# Patient Record
Sex: Female | Born: 1995 | Hispanic: No | Marital: Single | Smoking: Never smoker
Health system: Southern US, Community
[De-identification: ages and names within clinical notes are randomized; demographics above are authoritative.]

## PROBLEM LIST (undated history)

## (undated) DIAGNOSIS — J45909 Unspecified asthma, uncomplicated: Secondary | ICD-10-CM

---
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
## 2014-08-24 ENCOUNTER — Other Ambulatory Visit: Payer: Self-pay | Admitting: Advanced Practice Midwife

## 2015-04-03 IMAGING — CT CT NECK W/ CM
4 of 5 series · 16 of 33 positions shown, 18 images · IV contrast (Iodine)
Comparison: Prior radiograph performed earlier on the same day.

CLINICAL DATA: Sore throat

EXAM:
CT NECK WITH CONTRAST
TECHNIQUE: Multidetector CT imaging of the neck was performed using the
standard protocol following the bolus administration of intravenous
contrast.
CONTRAST:  75 cc of Omni 300.

[Series 201: soft tissue, idose (2) · axial · 0.45mm/px · z∈[+461,+587]mm · 4 of 107 slices shown]
[im 22/107  soft-tissue]
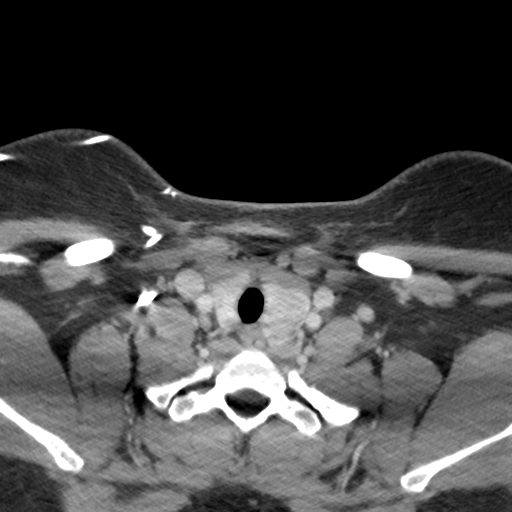
[im 43/107  soft-tissue]
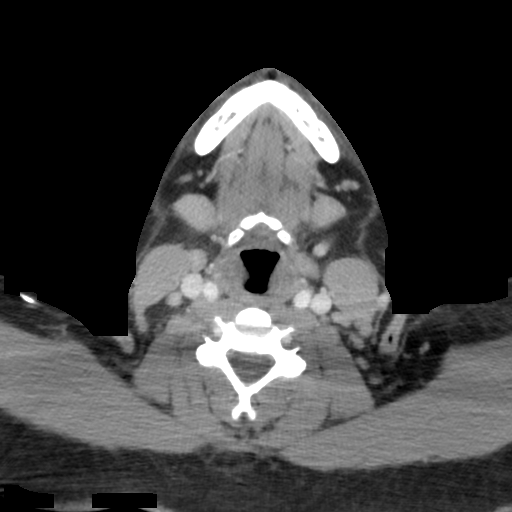
[im 64/107  soft-tissue]
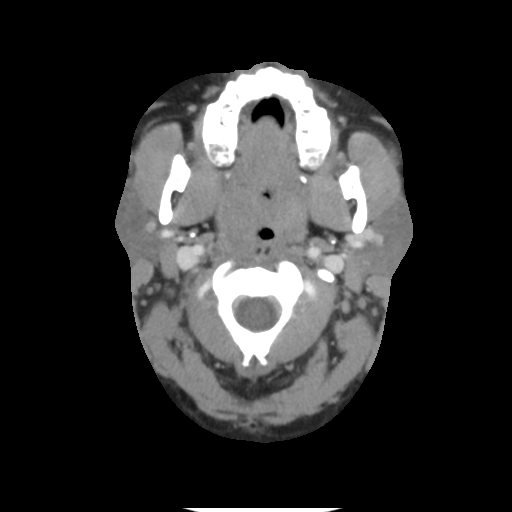
[im 85/107  soft-tissue]
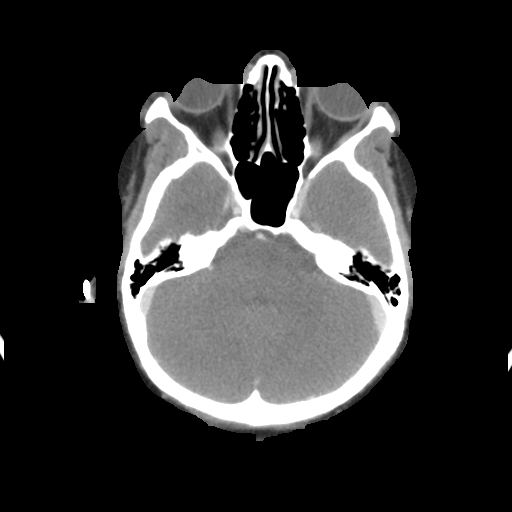

[Series 203: coronal, idose (2) · coronal · 0.45mm/px · 3 of 115 slices shown]
[im 23/115  bone]
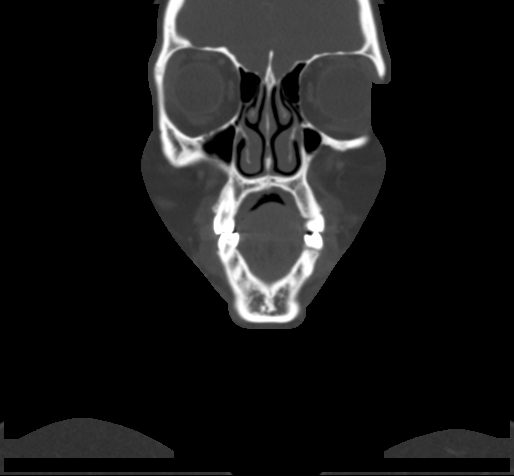
[im 46/115  bone]
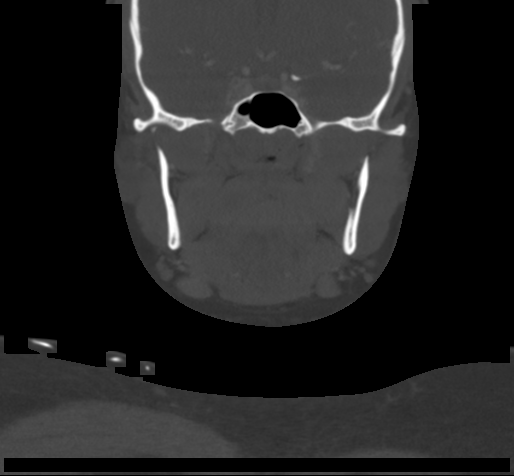
[im 69/115  bone]
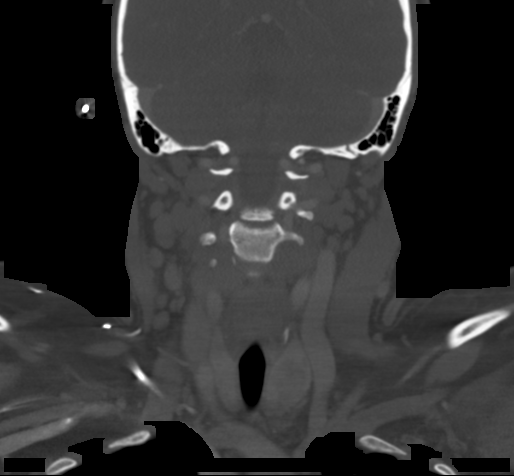

[Series 204: sagittal, idose (2) · sagittal · 0.45mm/px · 5 of 78 slices shown, 6 images]
[im 26/78  bone]
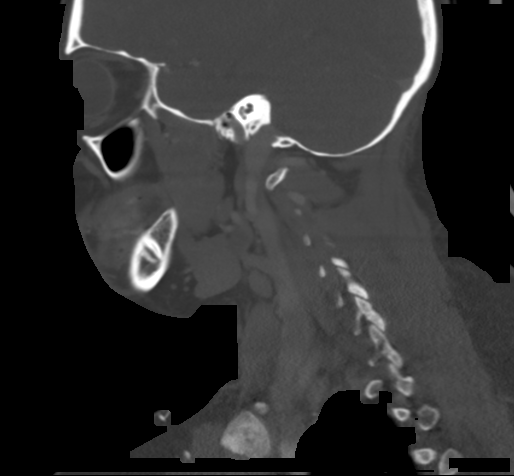
[im 33/78  bone]
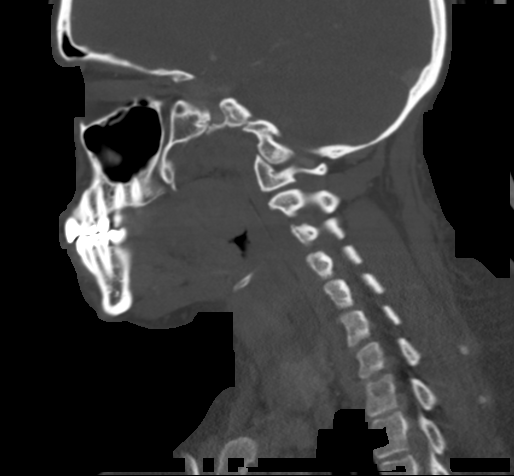
[im 39/78  soft-tissue]
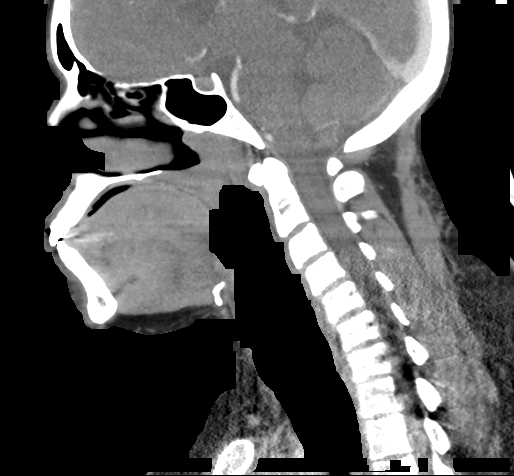
[im 39/78  bone]
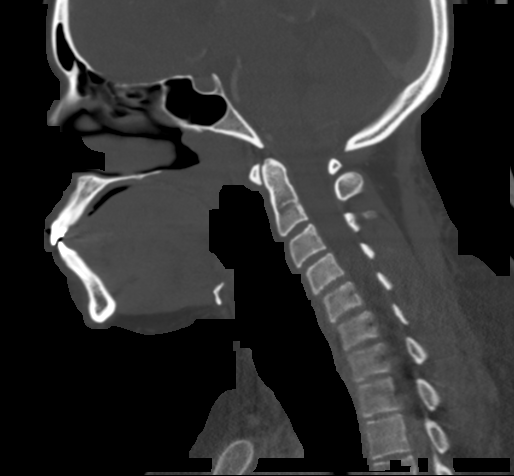
[im 45/78  bone]
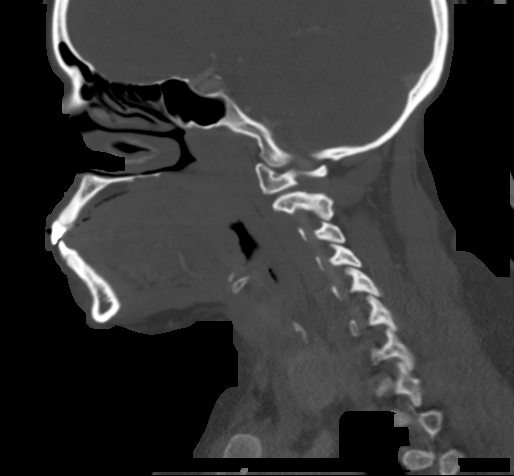
[im 52/78  bone]
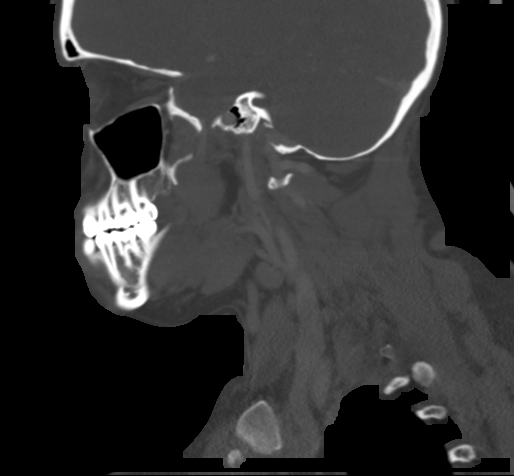

[Series 205: orthogonal, idose (2) · axial · 0.54mm/px · z∈[+441,+575]mm · 4 of 115 slices shown, 5 images]
[im 23/115  soft-tissue]
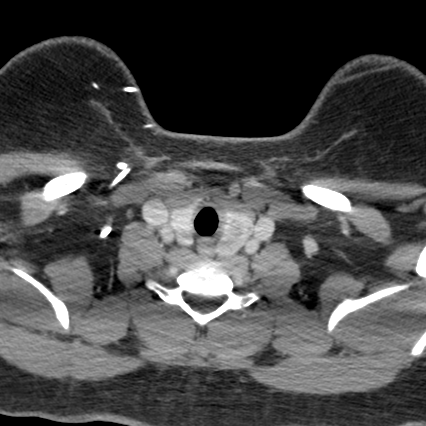
[im 23/115  bone]
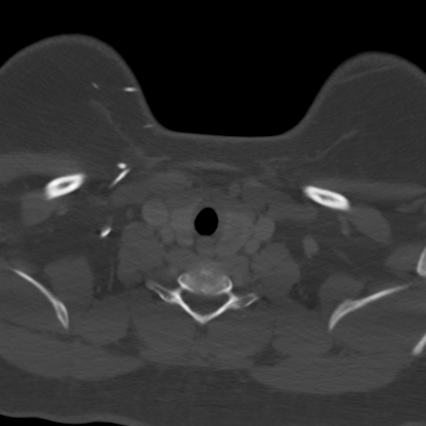
[im 46/115  bone]
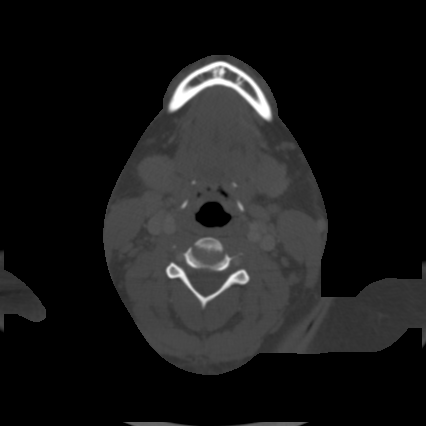
[im 69/115  bone]
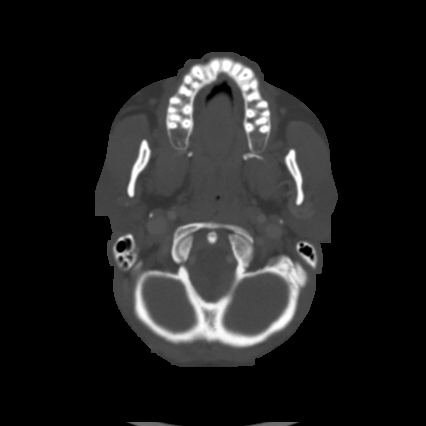
[im 92/115  bone]
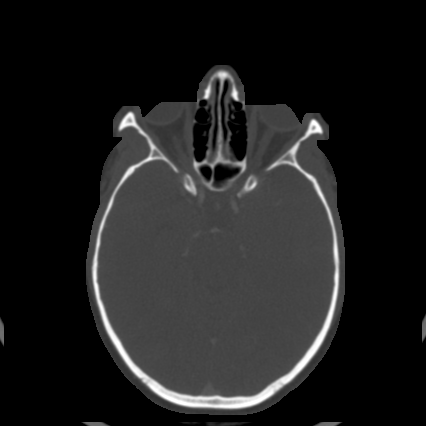

[16 of 33 positions shown; findings below may reference images not displayed]

FINDINGS: The visualized portions of the brain demonstrate a normal
appearance. Orbits are unremarkable.

Paranasal sinuses and mastoid air cells are clear.

The salivary glands including the parotid glands and submandibular
glands are normal. No significant submental soft tissue swelling.

Oral cavity within normal limits. Tonsils are symmetric and normal
bilaterally. Parapharyngeal fat is preserved. Nasopharynx within
normal limits. Mild swelling of the uvula noted.

No retropharyngeal fluid collection. Vallecula and lingual tonsils
are normal. No significant epiglottic swelling seen. Hypopharynx and
supraglottic larynx are normal. True vocal cords symmetric.
Subglottic airway is within normal limits.

Left lobe of thyroid is asymmetrically enlarged with a 1.4 x 1.9 x
1.7 cm heterogeneous hypodense nodule (series 204, image 45).
Subcentimeter 8 mm nodule noted within the posterior right thyroid
lobe.

Measure 1.1 cm in short axis. No other adenopathy within the neck.
No mass lesion or loculated fluid collection. Visualized superior
mediastinum within normal limits.

Visualized lungs are clear.

Normal intravascular enhancement seen within the neck. No acute
osseous abnormality. No worrisome lytic or blastic osseous lesions.
IMPRESSION: 1. Mild uvular swelling. No other significant inflammatory changes
identified within the neck. No evidence of epiglottitis.
2. Mildly prominent 1.1 cm bilateral level IIa nodes, nonspecific,
but may be reactive in nature. No other adenopathy.
3. 1.4 x 1.9 x 1.7 cm hypodense left thyroid nodule, indeterminate.
Further evaluation with dedicated thyroid ultrasound recommended.
This could be performed on a nonemergent basis.

## 2015-12-28 ENCOUNTER — Emergency Department (HOSPITAL_COMMUNITY)
Admission: EM | Admit: 2015-12-28 | Discharge: 2015-12-28 | Disposition: A | Discharge status: Home / Self Care | Payer: BLUE CROSS/BLUE SHIELD | Attending: Physician Assistant | Admitting: Physician Assistant

## 2015-12-28 ENCOUNTER — Encounter (HOSPITAL_COMMUNITY): Payer: Self-pay | Admitting: Emergency Medicine

## 2015-12-28 DIAGNOSIS — M940 Chondrocostal junction syndrome [Tietze]: Secondary | ICD-10-CM | POA: Insufficient documentation

## 2015-12-28 DIAGNOSIS — Z91010 Allergy to peanuts: Secondary | ICD-10-CM | POA: Insufficient documentation

## 2015-12-28 DIAGNOSIS — R0789 Other chest pain: Secondary | ICD-10-CM | POA: Diagnosis present

## 2015-12-28 DIAGNOSIS — J45909 Unspecified asthma, uncomplicated: Secondary | ICD-10-CM | POA: Diagnosis not present

## 2015-12-28 MED ORDER — IBUPROFEN 600 MG PO TABS: 600.0000 mg | ORAL_TABLET | Freq: Three times a day (TID) | ORAL | 0 refills | Status: AC | PRN

## 2015-12-28 MED ORDER — IBUPROFEN 800 MG PO TABS: 800.0000 mg | ORAL_TABLET | Freq: Three times a day (TID) | ORAL | 0 refills | Status: AC

## 2015-12-28 MED ORDER — CYCLOBENZAPRINE HCL 5 MG PO TABS: 10.0000 mg | ORAL_TABLET | Freq: Two times a day (BID) | ORAL | 0 refills | Status: AC | PRN

## 2015-12-28 MED ORDER — IBUPROFEN 800 MG PO TABS: 800.0000 mg | ORAL_TABLET | Freq: Four times a day (QID) | ORAL | 0 refills | Status: AC | PRN

## 2015-12-28 NOTE — ED Provider Notes (Signed)
[facility]-EMERGENCY DEPT Provider Note   CSN: [id number] Arrival date & time: 12/28/15  1239     History   Chief Complaint Chief Complaint  Patient presents with  . Shoulder Pain  . Other    rib pain    HPI Alexandria Bush is a 20 y.o. female.  Pt comes in with c/o left sided chest pain that radiates to the back. Denies sob, cough, fever, n/v. She states that she has been seen and was diagnosed with chest wall pain. She had an x-ray and it was negative. She states that the ibuprofen works when she takes it. She has not needed and albuterol. She denies any rash. She states that she is here because her mother told her she had to come.      Past Medical History:  Diagnosis Date  . Asthma     There are no active problems to display for this patient.   History reviewed. No pertinent surgical history.  OB History    No data available       Home Medications    Prior to Admission medications   Medication Sig Start Date End Date Taking? Authorizing Provider  albuterol (PROVENTIL HFA;VENTOLIN HFA) 108 (90 BASE) MCG/ACT inhaler Inhale 2 puffs into the lungs every 6 (six) hours as needed for wheezing or shortness of breath.    Historical Provider, MD  amoxicillin (AMOXIL) 500 MG capsule Take 1 capsule (500 mg total) by mouth 3 (three) times daily. 12/14/13   Azalia Bilis, MD  cyclobenzaprine (FLEXERIL) 5 MG tablet Take 2 tablets (10 mg total) by mouth 2 (two) times daily as needed for muscle spasms. 12/28/15   Teressa Lower, NP  etonogestrel-ethinyl estradiol (NUVARING) 0.12-0.015 MG/24HR vaginal ring Place 1 each vaginally every 28 (twenty-eight) days. Insert vaginally and leave in place for 3 consecutive weeks, then remove for 1 week.    Historical Provider, MD  fluticasone (FLONASE) 50 MCG/ACT nasal spray Place 2 sprays into both nostrils daily as needed for allergies or rhinitis.    Historical Provider, MD  HYDROcodone-acetaminophen (NORCO/VICODIN) 5-325 MG per tablet Take 1 tablet  by mouth every 4 (four) hours as needed for moderate pain. 12/14/13   Azalia Bilis, MD  ibuprofen (ADVIL,MOTRIN) 600 MG tablet Take 1 tablet (600 mg total) by mouth every 8 (eight) hours as needed. 12/28/15   Teressa Lower, NP  ibuprofen (ADVIL,MOTRIN) 800 MG tablet Take 1 tablet (800 mg total) by mouth 3 (three) times daily. 12/28/15   Teressa Lower, NP  ibuprofen (ADVIL,MOTRIN) 800 MG tablet Take 1 tablet (800 mg total) by mouth every 6 (six) hours as needed for fever or moderate pain. 12/28/15   Teressa Lower, NP  montelukast (SINGULAIR) 10 MG tablet Take 10 mg by mouth daily as needed (allergies).    Historical Provider, MD  ondansetron (ZOFRAN ODT) 8 MG disintegrating tablet Take 1 tablet (8 mg total) by mouth every 8 (eight) hours as needed for nausea or vomiting. 12/14/13   Azalia Bilis, MD    Family History History reviewed. No pertinent family history.  Social History Social History  Substance Use Topics  . Smoking status: Never Smoker  . Smokeless tobacco: Not on file  . Alcohol use No     Allergies   Peanut-containing drug products and Shellfish allergy   Review of Systems Review of Systems  All other systems reviewed and are negative.    Physical Exam Updated Vital Signs BP 110/65   Pulse 72   Temp 97.7 F (36.5  C) (Oral)   Resp 20   SpO2 100%   Physical Exam  Constitutional: She appears well-developed and well-nourished.  HENT:  Head: Normocephalic.  Neck: Normal range of motion. Neck supple.  Cardiovascular: Normal rate.   Pulmonary/Chest: Effort normal and breath sounds normal.    Abdominal: Soft. Bowel sounds are normal.  Musculoskeletal: Normal range of motion.  Neurological: She is alert.  Skin: Skin is warm.  Psychiatric: She has a normal mood and affect.  Nursing note and vitals reviewed.    ED Treatments / Results  Labs (all labs ordered are listed, but only abnormal results are displayed) Labs Reviewed - No data to display  EKG   EKG Interpretation None       Radiology No results found.  Procedures Procedures (including critical care time)  Medications Ordered in ED Medications - No data to display   Initial Impression / Assessment and Plan / ED Course  I have reviewed the triage vital signs and the nursing notes.  Pertinent labs & imaging results that were available during my care of the patient were reviewed by me and considered in my medical decision making (see chart for details).  Clinical Course    Discussed supportive treatment and follow up precautions.  Final Clinical Impressions(s) / ED Diagnoses   Final diagnoses:  Costochondritis    New Prescriptions New Prescriptions   CYCLOBENZAPRINE (FLEXERIL) 5 MG TABLET    Take 2 tablets (10 mg total) by mouth 2 (two) times daily as needed for muscle spasms.   IBUPROFEN (ADVIL,MOTRIN) 800 MG TABLET    Take 1 tablet (800 mg total) by mouth 3 (three) times daily.     Teressa LowerVrinda Arly Salminen, NP 12/28/15 1603    Courteney Randall AnLyn Mackuen, MD 12/30/15 1102

## 2015-12-28 NOTE — ED Notes (Signed)
E signature not available patient denies concerns with dc 

## 2015-12-28 NOTE — ED Notes (Signed)
Pt taking advil 600mg  po in waiting room-- own medicine.

## 2015-12-28 NOTE — ED Triage Notes (Signed)
Pt sts left sided rib pain into back and shoulder x 1 week
